# Patient Record
Sex: Male | Born: 1990 | Race: Black or African American | Hispanic: No | Marital: Single | Smoking: Current every day smoker
Health system: Southern US, Community
[De-identification: ages and names within clinical notes are randomized; demographics above are authoritative.]

---
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
## 2009-05-07 IMAGING — CR DG CHEST 1V PORT
1 series · 1 of 1 positions shown · non-contrast
Comparison: None.

CLINICAL DATA: Penetrating trauma to the right lower chest.

PORTABLE CHEST - 1 VIEW  [DATE]/8007 1046 hours:

[view not recorded]
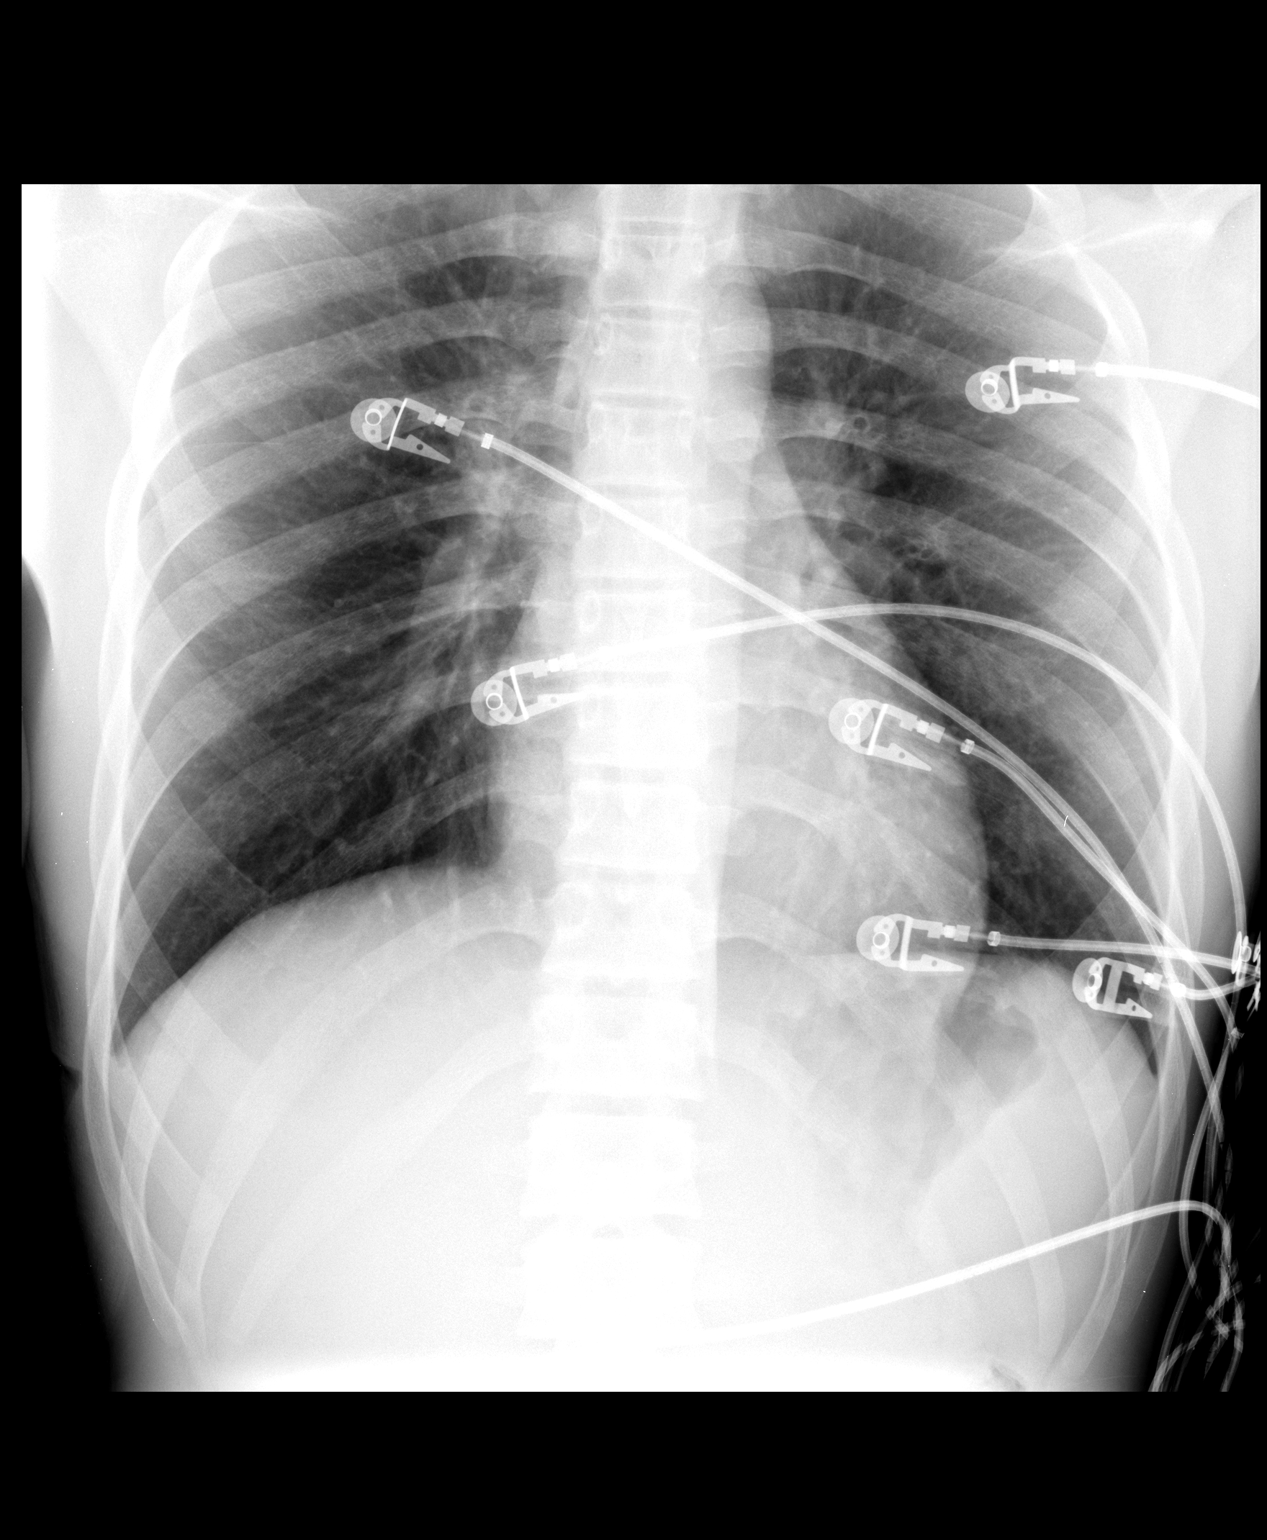

[1 of 1 positions shown; findings below may reference images not displayed]

FINDINGS: Soft tissue injury overlying the right lower chest or presumably
related to the patient's laceration. Blunting of the right costophrenic angle
consistent with small effusion or hemothorax. No right pneumothorax visualized.
No airspace consolidation to suggest pulmonary contusion. Lungs clear
throughout. Cardiomediastinal silhouette unremarkable. Visualized bony thorax
intact.
IMPRESSION: Small right pleural effusion or hemothorax. No pneumothorax. No pulmonary
parenchymal abnormalities.
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
## 2011-05-26 LAB — URINE CULTURE: Culture: NO GROWTH

## 2011-05-26 LAB — URINALYSIS, ROUTINE W REFLEX MICROSCOPIC
Ketones, ur: 15 — AB
Nitrite: NEGATIVE
Specific Gravity, Urine: 1.028

## 2011-05-26 LAB — URINE MICROSCOPIC-ADD ON

## 2011-06-09 LAB — I-STAT EC8
BUN: 8
Bicarbonate: 23.7
Chloride: 106
Potassium: 3.5
Sodium: 139
TCO2: 25
pCO2 arterial: 36
pH, Arterial: 7.426

## 2011-06-09 LAB — TYPE AND SCREEN
ABO/RH(D): A POS
Antibody Screen: NEGATIVE

## 2011-06-09 LAB — PROTIME-INR: Prothrombin Time: 14.7

## 2014-08-03 ENCOUNTER — Encounter (HOSPITAL_COMMUNITY): Payer: Self-pay | Admitting: *Deleted

## 2014-08-03 ENCOUNTER — Emergency Department (HOSPITAL_COMMUNITY)
Admission: EM | Admit: 2014-08-03 | Discharge: 2014-08-03 | Disposition: A | Discharge status: Home / Self Care | Payer: Self-pay | Attending: Emergency Medicine | Admitting: Emergency Medicine

## 2014-08-03 DIAGNOSIS — Z113 Encounter for screening for infections with a predominantly sexual mode of transmission: Secondary | ICD-10-CM | POA: Insufficient documentation

## 2014-08-03 DIAGNOSIS — R000 Tachycardia, unspecified: Secondary | ICD-10-CM | POA: Insufficient documentation

## 2014-08-03 DIAGNOSIS — R300 Dysuria: Secondary | ICD-10-CM | POA: Insufficient documentation

## 2014-08-03 DIAGNOSIS — Z720 Tobacco use: Secondary | ICD-10-CM | POA: Insufficient documentation

## 2014-08-03 DIAGNOSIS — R369 Urethral discharge, unspecified: Secondary | ICD-10-CM | POA: Insufficient documentation

## 2014-08-03 LAB — RPR

## 2014-08-03 LAB — HIV ANTIBODY (ROUTINE TESTING W REFLEX): HIV: NONREACTIVE

## 2014-08-03 MED ORDER — AZITHROMYCIN 1 G PO PACK
1.0000 g | PACK | Freq: Once | ORAL | Status: AC
  Administered 2014-08-03: 1 g via ORAL
  Filled 2014-08-03: qty 1

## 2014-08-03 MED ORDER — CEFTRIAXONE SODIUM 250 MG IJ SOLR
250.0000 mg | Freq: Once | INTRAMUSCULAR | Status: AC
  Administered 2014-08-03: 250 mg via INTRAMUSCULAR
  Filled 2014-08-03: qty 250

## 2014-08-03 NOTE — ED Notes (Signed)
Pt reports white penile discharge, noticed today. Last date of intercourse 3 days ago. Partner has no symptoms.

## 2014-08-03 NOTE — Discharge Instructions (Signed)
You were tested and treated for STDs. You will get a call if anything comes back positive. Do not have sex for 1 week to give the antibiotics time to work. Go to the Urgent Care if you symptoms do not resolve.

## 2014-08-03 NOTE — ED Notes (Signed)
Pt placed urine specimen in room and left without discharge instructions. MD notified.

## 2014-08-03 NOTE — ED Provider Notes (Signed)
CSN: [id number]     Arrival date & time 08/03/14  1535 History   First MD Initiated Contact with Patient 08/03/14 1552     Chief Complaint  Patient presents with  . Exposure to STD     (Consider location/radiation/quality/duration/timing/severity/associated sxs/prior Treatment) HPI  He is a [age] year old man here for evaluation of STDs. He states he had unprotected sex about 3 days ago with a new partner. Today, he noticed some suprapubic abdominal pain as well as dysuria and white penile discharge. He denies any fevers or chills.  History reviewed. No pertinent past medical history. History reviewed. No pertinent past surgical history. No family history on file. History  Substance Use Topics  . Smoking status: Current Every Day Smoker  . Smokeless tobacco: Not on file  . Alcohol Use: No    Review of Systems  Gastrointestinal: Positive for abdominal pain.  Genitourinary: Positive for dysuria and discharge. Negative for testicular pain.      Allergies  Review of patient's allergies indicates no known allergies.  Home Medications   Prior to Admission medications   Not on File   BP 131/74 mmHg  Pulse 120  Temp(Src) 97.5 F (36.4 C)  Resp 16  Ht 6\' 1"  (1.854 m)  Wt 150 lb (68.04 kg)  BMI 19.79 kg/m2  SpO2 99% Physical Exam  Constitutional: He is oriented to person, place, and time. He appears well-developed and well-nourished. No distress.  Cardiovascular: Tachycardia present.   Pulmonary/Chest: Effort normal.  Genitourinary: Circumcised. Discharge found.  Neurological: He is alert and oriented to person, place, and time.    ED Course  Procedures (including critical care time) Labs Review Labs Reviewed  HIV ANTIBODY (ROUTINE TESTING)  RPR  URINE CYTOLOGY ANCILLARY ONLY    Imaging Review No results found.   EKG Interpretation None      MDM   Final diagnoses:  None    Will get urine for gonorrhea and chlamydia. Also blood work for HIV and  RPR. Presumptively treated with Rocephin 250 mg IM and azithromycin 1 g by mouth. Follow-up as needed.    Charm RingsErin J Jameon Deller, MD 08/03/14 [phone]

## 2014-08-03 NOTE — ED Notes (Signed)
The pt thinks he may have a std.  Penis discharge just noticed today

## 2014-08-05 LAB — URINE CYTOLOGY ANCILLARY ONLY
Chlamydia: NEGATIVE
NEISSERIA GONORRHEA: POSITIVE — AB
TRICH (WINDOWPATH): NEGATIVE

## 2014-08-06 ENCOUNTER — Telehealth (HOSPITAL_COMMUNITY): Payer: Self-pay | Admitting: *Deleted

## 2014-08-06 NOTE — ED Notes (Addendum)
GC pos. Chlamydia and Trich neg., HIV/RPR non-reactive. Pt. adequately treated with Rocephin and Zithromax.  I called and got a fast busy signal.  Other number incorrect. Cherly AndersonYork, Cynthia Cogle M 08/06/2014 I called mobile number and got a fast busy signal.  Call 2.  Confidential marked letter sent with results and instructions.  DHHS form completed and faxed to the [facility]. 08/07/2014

## 2014-08-27 ENCOUNTER — Emergency Department (HOSPITAL_COMMUNITY)
Admission: EM | Admit: 2014-08-27 | Discharge: 2014-08-27 | Disposition: A | Discharge status: Home / Self Care | Payer: Self-pay | Attending: Emergency Medicine | Admitting: Emergency Medicine

## 2014-08-27 ENCOUNTER — Encounter (HOSPITAL_COMMUNITY): Payer: Self-pay | Admitting: *Deleted

## 2014-08-27 DIAGNOSIS — Z202 Contact with and (suspected) exposure to infections with a predominantly sexual mode of transmission: Secondary | ICD-10-CM | POA: Insufficient documentation

## 2014-08-27 DIAGNOSIS — Z8619 Personal history of other infectious and parasitic diseases: Secondary | ICD-10-CM | POA: Insufficient documentation

## 2014-08-27 DIAGNOSIS — R350 Frequency of micturition: Secondary | ICD-10-CM | POA: Insufficient documentation

## 2014-08-27 DIAGNOSIS — Z720 Tobacco use: Secondary | ICD-10-CM | POA: Insufficient documentation

## 2014-08-27 DIAGNOSIS — Z711 Person with feared health complaint in whom no diagnosis is made: Secondary | ICD-10-CM

## 2014-08-27 DIAGNOSIS — R300 Dysuria: Secondary | ICD-10-CM | POA: Insufficient documentation

## 2014-08-27 DIAGNOSIS — R369 Urethral discharge, unspecified: Secondary | ICD-10-CM | POA: Insufficient documentation

## 2014-08-27 LAB — URINE MICROSCOPIC-ADD ON

## 2014-08-27 LAB — URINALYSIS, ROUTINE W REFLEX MICROSCOPIC
Bilirubin Urine: NEGATIVE
Glucose, UA: NEGATIVE mg/dL
KETONES UR: NEGATIVE mg/dL
Nitrite: NEGATIVE
PROTEIN: NEGATIVE mg/dL
Specific Gravity, Urine: 1.026 (ref 1.005–1.030)
UROBILINOGEN UA: 1 mg/dL (ref 0.0–1.0)
pH: 6 (ref 5.0–8.0)

## 2014-08-27 LAB — HIV ANTIBODY (ROUTINE TESTING W REFLEX): HIV 1&2 Ab, 4th Generation: NONREACTIVE

## 2014-08-27 LAB — RPR

## 2014-08-27 MED ORDER — AZITHROMYCIN 250 MG PO TABS
1000.0000 mg | ORAL_TABLET | Freq: Once | ORAL | Status: AC
  Administered 2014-08-27: 1000 mg via ORAL
  Filled 2014-08-27: qty 4

## 2014-08-27 MED ORDER — CEFTRIAXONE SODIUM 250 MG IJ SOLR
250.0000 mg | Freq: Once | INTRAMUSCULAR | Status: AC
  Administered 2014-08-27: 250 mg via INTRAMUSCULAR
  Filled 2014-08-27: qty 250

## 2014-08-27 MED ORDER — METRONIDAZOLE 500 MG PO TABS
2000.0000 mg | ORAL_TABLET | Freq: Once | ORAL | Status: AC
  Administered 2014-08-27: 2000 mg via ORAL
  Filled 2014-08-27: qty 4

## 2014-08-27 NOTE — Discharge Instructions (Signed)
Follow up with [facility]D [facility] for future STD concerns or screenings. This is the recommendation by the CDC for people with multiple sexual partners or hx of STDs. You have been treated for gonorrhea and chlamydia in the ER but the hospital will call you if lab is positive. You were tested for HIV and Syphilis, and the hospital will call you if the lab is positive. Get all sexual partners tested and treated, avoid unprotected sex.  Emergency Department Resource Guide 1) Find a Doctor and Pay Out of Pocket Although you won't have to find out who is covered by your insurance plan, it is a good idea to ask around and get recommendations. You will then need to call the office and see if the doctor you have chosen will accept you as a new patient and what types of options they offer for patients who are self-pay. Some doctors offer discounts or will set up payment plans for their patients who do not have insurance, but you will need to ask so you aren't surprised when you get to your appointment.  2) Contact Your Local Health Department Not all health departments have doctors that can see patients for sick visits, but many do, so it is worth a call to see if yours does. If you don't know where your local health department is, you can check in your phone book. The CDC also has a tool to help you locate your state's health department, and many state websites also have listings of all of their local health departments.  3) Find a Walk-in Clinic If your illness is not likely to be very severe or complicated, you may want to try a walk in clinic. These are popping up all over the country in pharmacies, drugstores, and shopping centers. They're usually staffed by nurse practitioners or physician assistants that have been trained to treat common illnesses and complaints. They're usually fairly quick and inexpensive. However, if you have serious medical issues or chronic medical problems,  these are probably not your best option.  No Primary Care Doctor: - Call Health Connect at  [phone] - they can help you locate a primary care doctor that  accepts your insurance, provides certain services, etc. - Physician Referral Service- [phone]  Chronic Pain Problems: Organization         Address  Phone   Notes  [facility]  [phone] Patients need to be referred by their primary care doctor.   Medication Assistance: Organization         Address  Phone   Notes  [facility] [address] [place], [place] [address] [phone] --Must be a resident of [facility] -- Must have NO insurance coverage whatsoever (no Medicaid/ Medicare, etc.) -- The pt. MUST have a primary care doctor that directs their care regularly and follows them in the community   MedAssist  [phone]   [employer]  [phone]    Agencies that provide inexpensive medical care: Organization         Address  Phone   Notes  [facility]  [phone]) [phone]   [facility]    [phone]   [facility] [address] [place], [place] [address] [phone]   [facility] [address], [place] [phone]   [facility]    [phone]   [facility]    [phone]   [facility]  [address], [place] Phone:  [phone]) [phone], Fax:  [phone] Hours of Operation:  9 am - 6 pm, M-F.  Also accepts Medicaid/Medicare and self-pay.  [facility]  [address], [place] Phone: [phone], Fax: [phone]. Hours of Operation:  8:30 am - 5:30 pm, M-F.  Also accepts Medicaid and self-pay.  [facility] [place] [address], [place] Phone: [phone]) [phone]   [facility] [address], [place], [place] [phone], Ext. [phone] Mondays &  Thursdays: 7-9 AM.  First 15 patients are seen on a first come, first serve basis.    Medicaid-accepting [facility] Providers:  Organization         Address  Phone   Notes  [facility] [address], [place] [phone] Also accepts self-pay patients.  [facility] [address], [place]  [phone]   [facility] [address], [place] [phone]) [phone]   [facility] [address] [place] [address], [place] [phone]   Renaye RakersVeita Bland [address], [place]   [phone] Only accepts [place] patients after they have their name applied to their card.   Self-Pay (no insurance) in [facility]:  Organization         Address  Phone   Notes  Sickle Cell Patients, [facility] [address], [place] [phone]   [facility] [address], [place] [phone]   [facility] [place]  [address], [place] [phone]   [facility]/Dr. Osei-Bonsu  [address] [place] [address], [place] or [phone], [place] [phone] Phone number for both [place] and [place] locations is the same.  [facility] [address], [place] [phone]   [facility] [place] [address] [place] [address], [place] or [address] [phone]   [facility] [address], [place] [phone], phone; [phone], fax Sees patients 1st and 3rd Saturday of every month.  Must not qualify for public or private insurance (i.e. Medicaid, Medicare, [employer] Choice, Veterans' Benefits)  Household income should be no more than 200% of the poverty level The clinic cannot treat you if you are pregnant or think you are pregnant  Sexually transmitted diseases are not treated at the  clinic.

## 2014-08-27 NOTE — ED Provider Notes (Signed)
CSN: [id number]     Arrival date & time 08/27/14  1136 History  This chart was scribed for non-physician practitioner, Allen DerryMercedes Camprubi-Soms, PA-C working with Richardean Canalavid H Yao, MD by Greggory StallionKayla Andersen, ED scribe. This patient was seen in room TR09C/TR09C and the patient's care was started at 12:16 PM.   Chief Complaint  Patient presents with  . Penile Discharge   Patient is a 23 y.o. male presenting with penile discharge. The history is provided by the patient. No language interpreter was used.  Penile Discharge This is a new problem. The problem has not changed since onset.Pertinent negatives include no abdominal pain. Nothing aggravates the symptoms. Nothing relieves the symptoms. He has tried nothing for the symptoms.    HPI Comments: [name] is a 23 y.o. male who presents to the Emergency Department complaining of constant penile discharge and dysuria that started 2 days ago. He has also had urinary frequency. Reports recent unprotected vaginal sex with one person. He has had 5 partners within the last year. Pt was treated on 08/03/14 for gonorrhea. Denies fever, eye pain, eye redness, abdominal pain, nausea, emesis, penile swelling, testicular swelling, rectal pain, hematuria, dyspareunia.   History reviewed. No pertinent past medical history. History reviewed. No pertinent past surgical history. History reviewed. No pertinent family history. History  Substance Use Topics  . Smoking status: Current Every Day Smoker  . Smokeless tobacco: Not on file  . Alcohol Use: No    Review of Systems  Constitutional: Negative for fever.  Eyes: Negative for pain and redness.  Gastrointestinal: Negative for nausea, vomiting and abdominal pain.  Genitourinary: Positive for dysuria and discharge. Negative for hematuria, penile swelling and scrotal swelling.   10 systems reviewed and are negative for acute changes except as noted in the HPI.  Allergies  Review of patient's allergies indicates no  known allergies.  Home Medications   Prior to Admission medications   Not on File   BP 122/76 mmHg  Pulse 95  Temp(Src) 97.9 F (36.6 C) (Oral)  Resp 18  SpO2 98%   Physical Exam  Constitutional: He is oriented to person, place, and time. Vital signs are normal. He appears well-developed and well-nourished.  Non-toxic appearance. No distress.  Afebrile, non toxic, NAD  HENT:  Head: Normocephalic and atraumatic.  Mouth/Throat: Mucous membranes are normal.  Eyes: Conjunctivae and EOM are normal.  Neck: Neck supple.  Cardiovascular: Normal rate.   Pulmonary/Chest: Effort normal. No respiratory distress.  Abdominal: Normal appearance. He exhibits no distension. Hernia confirmed negative in the right inguinal area and confirmed negative in the left inguinal area.  Genitourinary: Testes normal. Cremasteric reflex is present. Circumcised. No phimosis, paraphimosis, hypospadias, penile erythema or penile tenderness. Discharge found.  Circumcised penis without phimosis/paraphimosis, hypospadias, erythema, tenderness, with mild mucoid discharge at urethral meatus. Testes with no masses or tenderness, no swelling, and cremasterics reflex present bilaterally. No inguinal hernias or adenopathy present.   Musculoskeletal: Normal range of motion.  Neurological: He is alert and oriented to person, place, and time. He has normal strength. No sensory deficit. Gait normal.  Skin: Skin is warm, dry and intact.  Psychiatric: He has a normal mood and affect. His behavior is normal.  Nursing note and vitals reviewed.   ED Course  Procedures (including critical care time)  DIAGNOSTIC STUDIES: Oxygen Saturation is 98% on RA, normal by my interpretation.    COORDINATION OF CARE: 12:19 PM-Discussed treatment plan which includes STD testing and treatment with pt at bedside  and pt agreed to plan.   Labs Review Labs Reviewed  URINALYSIS, ROUTINE W REFLEX MICROSCOPIC - Abnormal; Notable for the  following:    APPearance CLOUDY (*)    Hgb urine dipstick TRACE (*)    Leukocytes, UA MODERATE (*)    All other components within normal limits  URINE MICROSCOPIC-ADD ON - Abnormal; Notable for the following:    Bacteria, UA FEW (*)    All other components within normal limits  URINE CULTURE  GC/CHLAMYDIA PROBE AMP  RPR  HIV ANTIBODY (ROUTINE TESTING)    Imaging Review No results found.   EKG Interpretation None      MDM   Final diagnoses:  Penile discharge  Dysuria  Concern about STD in male without diagnosis    23 y.o. male with penile discharge. Exam reveals mild discharge. STD testing performed, empiric treatment given. Discussed getting partners tested and treated. I explained the diagnosis and have given explicit precautions to return to the ER including for any other new or worsening symptoms. The patient understands and accepts the medical plan as it's been dictated and I have answered their questions. Discharge instructions concerning home care and prescriptions have been given. The patient is STABLE and is discharged to home in good condition.  BP 122/76 mmHg  Pulse 95  Temp(Src) 97.9 F (36.6 C) (Oral)  Resp 18  SpO2 98%  Meds ordered this encounter  Medications  . azithromycin (ZITHROMAX) tablet 1,000 mg    Sig:    And  . cefTRIAXone (ROCEPHIN) injection 250 mg    Sig:     Order Specific Question:  Antibiotic Indication:    Answer:  STD   And  . metroNIDAZOLE (FLAGYL) tablet 2,000 mg    Sig:      I personally performed the services described in this documentation, which was scribed in my presence. The recorded information has been reviewed and is accurate.  Donnita FallsMercedes Strupp [place] Creekamprubi-Soms, [place]-C 08/27/14 1325  Richardean Canalavid H Yao, MD 08/27/14 [phone]

## 2014-08-27 NOTE — ED Notes (Signed)
Patient states he was having unprotected sex again and has "it" again.

## 2014-08-28 LAB — URINE CULTURE
COLONY COUNT: NO GROWTH
Culture: NO GROWTH
SPECIAL REQUESTS: NORMAL

## 2014-08-28 LAB — GC/CHLAMYDIA PROBE AMP
CT Probe RNA: NEGATIVE
GC PROBE AMP APTIMA: POSITIVE — AB

## 2014-08-29 ENCOUNTER — Telehealth (HOSPITAL_BASED_OUTPATIENT_CLINIC_OR_DEPARTMENT_OTHER): Payer: Self-pay | Admitting: *Deleted

## 2014-08-29 ENCOUNTER — Telehealth (HOSPITAL_COMMUNITY): Payer: Self-pay

## 2014-08-29 NOTE — ED Notes (Signed)
Informed of lab results. Treated per protocol for gonorrhea. Advised to abstain from sexual activity x 10 days and notify partner.

## 2014-09-04 NOTE — ED Notes (Signed)
1/4 Letter sent 12/9 returned unopened.  Pt. did not receive lab results from 12/5. Vassie MoselleYork, Gordana Kewley M 09/04/2014
